# Patient Record
Sex: Male | Born: 2000 | Race: Black or African American | Hispanic: No | Marital: Single | Smoking: Former smoker
Health system: Southern US, Community
[De-identification: ages and names within clinical notes are randomized; demographics above are authoritative.]

---
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
## 2021-01-19 ENCOUNTER — Emergency Department (HOSPITAL_BASED_OUTPATIENT_CLINIC_OR_DEPARTMENT_OTHER)
Admission: EM | Admit: 2021-01-19 | Discharge: 2021-01-19 | Disposition: A | Discharge status: Home / Self Care | Payer: No Typology Code available for payment source | Attending: Emergency Medicine | Admitting: Emergency Medicine

## 2021-01-19 ENCOUNTER — Other Ambulatory Visit: Payer: Self-pay

## 2021-01-19 ENCOUNTER — Other Ambulatory Visit (HOSPITAL_BASED_OUTPATIENT_CLINIC_OR_DEPARTMENT_OTHER): Payer: Self-pay

## 2021-01-19 ENCOUNTER — Encounter (HOSPITAL_BASED_OUTPATIENT_CLINIC_OR_DEPARTMENT_OTHER): Payer: Self-pay

## 2021-01-19 DIAGNOSIS — B349 Viral infection, unspecified: Secondary | ICD-10-CM | POA: Insufficient documentation

## 2021-01-19 DIAGNOSIS — Z28310 Unvaccinated for covid-19: Secondary | ICD-10-CM | POA: Insufficient documentation

## 2021-01-19 DIAGNOSIS — Z20822 Contact with and (suspected) exposure to covid-19: Secondary | ICD-10-CM | POA: Insufficient documentation

## 2021-01-19 DIAGNOSIS — F17290 Nicotine dependence, other tobacco product, uncomplicated: Secondary | ICD-10-CM | POA: Insufficient documentation

## 2021-01-19 DIAGNOSIS — R0981 Nasal congestion: Secondary | ICD-10-CM | POA: Diagnosis present

## 2021-01-19 LAB — SARS CORONAVIRUS 2 (TAT 6-24 HRS): SARS Coronavirus 2: NEGATIVE

## 2021-01-19 MED ORDER — FLUTICASONE PROPIONATE 50 MCG/ACT NA SUSP
1.0000 | Freq: Every day | NASAL | 0 refills | Status: DC
  Filled 2021-01-19: qty 16, 60d supply, fill #0

## 2021-01-19 MED ORDER — BENZONATATE 100 MG PO CAPS: 100.0000 mg | ORAL_CAPSULE | Freq: Three times a day (TID) | ORAL | 0 refills | Status: AC

## 2021-01-19 NOTE — Discharge Instructions (Addendum)
You likely have a viral illness.  This should be treated symptomatically. Your COVID test is pending.  You may check the results on MyChart. Use Tylenol or ibuprofen as needed for fevers or body aches. Use Flonase daily for nasal congestion and cough. Use Tessalon for cough. Make sure you stay well-hydrated with water. Wash your hands frequently to prevent spread of infection. Follow-up with your primary care doctor in 1 week if your symptoms are not improving. Return to the emergency room if you develop chest pain, difficulty breathing, or any new or worsening symptoms.

## 2021-01-19 NOTE — ED Provider Notes (Signed)
[facility] EMERGENCY DEPARTMENT Provider Note   CSN: [id number] Arrival date & time: 01/19/21  1108     History Chief Complaint  Patient presents with  . Covid Exposure    Max French is a 20 y.o. male presenting for evaluation of nasal congestion, sore throat, cough.  Patient states his symptoms began 2 days ago.  His sore throat is improving, however he continues to have significant congestion and a productive cough.  He denies fevers, chest pain, shortness of breath, nausea, vomiting, Donnell pain.  He has no medical problems, takes no medications daily.  He is unvaccinated for COVID.  He was exposed to somebody about 10 days ago who tested positive for COVID.  He had similar symptoms a year ago when he tested positive for COVID.  HPI     History reviewed. No pertinent past medical history.  There are no problems to display for this patient.   History reviewed. No pertinent surgical history.     History reviewed. No pertinent family history.  Social History   Tobacco Use  . Smoking status: Every Day    Types: Cigars  . Smokeless tobacco: Never  Vaping Use  . Vaping Use: Never used  Substance Use Topics  . Alcohol use: Yes    Comment: occ  . Drug use: Yes    Types: Marijuana    Home Medications Prior to Admission medications   Medication Sig Start Date End Date Taking? Authorizing Provider  benzonatate (TESSALON) 100 MG capsule Take 1 capsule (100 mg total) by mouth every 8 (eight) hours. 01/19/21  Yes Shonte Soderlund, PA-C  fluticasone (FLONASE) 50 MCG/ACT nasal spray Place 1 spray into both nostrils daily. 01/19/21  Yes Miciah Covelli, PA-C    Allergies    Patient has no known allergies.  Review of Systems   Review of Systems  HENT:  Positive for congestion and sore throat (resolved).   Respiratory:  Positive for cough.   All other systems reviewed and are negative.  Physical Exam Updated Vital Signs BP 132/82 (BP Location: Left  Arm)   Pulse 86   Temp 98.5 F (36.9 C) (Oral)   Resp 16   Ht 5\' 8"  (1.727 m)   Wt 68 kg   SpO2 96%   BMI 22.81 kg/m   Physical Exam Vitals and nursing note reviewed.  Constitutional:      General: He is not in acute distress.    Appearance: Normal appearance.     Comments: Resting in the bed in NAD  HENT:     Head: Normocephalic and atraumatic.     Right Ear: Tympanic membrane, ear canal and external ear normal.     Left Ear: Tympanic membrane, ear canal and external ear normal.     Nose: Mucosal edema and congestion present.     Mouth/Throat:     Pharynx: Uvula midline. Posterior oropharyngeal erythema present.     Comments: OP mildly erythematous without tonsillar swelling or exudates.  Uvula midline. Eyes:     Extraocular Movements: Extraocular movements intact.     Conjunctiva/sclera: Conjunctivae normal.     Pupils: Pupils are equal, round, and reactive to light.  Cardiovascular:     Rate and Rhythm: Normal rate and regular rhythm.     Pulses: Normal pulses.  Pulmonary:     Effort: Pulmonary effort is normal.     Breath sounds: Normal breath sounds. No decreased breath sounds, wheezing, rhonchi or rales.     Comments: Speaking  in full sentences.  Clear lung sounds in all fields.  Sats stable on room air. Abdominal:     General: There is no distension.     Palpations: Abdomen is soft.     Tenderness: There is no abdominal tenderness.  Musculoskeletal:        General: Normal range of motion.     Cervical back: Normal range of motion.  Lymphadenopathy:     Cervical: No cervical adenopathy.  Skin:    General: Skin is warm.     Capillary Refill: Capillary refill takes less than 2 seconds.  Neurological:     Mental Status: He is alert and oriented to person, place, and time.    ED Results / Procedures / Treatments   Labs (all labs ordered are listed, but only abnormal results are displayed) Labs Reviewed  SARS CORONAVIRUS 2 (TAT 6-24 HRS)     EKG None  Radiology No results found.  Procedures Procedures   Medications Ordered in ED Medications - No data to display  ED Course  I have reviewed the triage vital signs and the nursing notes.  Pertinent labs & imaging results that were available during my care of the patient were reviewed by me and considered in my medical decision making (see chart for details).    MDM Rules/Calculators/A&P                           Patient presenting with 3 days h/o URI symptoms.  Physical exam reassuring, patient is afebrile and appears nontoxic.  Pulmonary exam reassuring.  Doubt pneumonia, strep, other bacterial infection, or peritonsillar abscess.  Likely viral URI.  Will treat symptomatically. Covid test pending.  Patient to follow-up with primary care as needed.  At this time, patient appears safe for discharge.  Return precautions given.  Patient states he understands and agrees to plan.  Max French was evaluated in Emergency Department on 01/19/2021 for the symptoms described in the history of present illness. He was evaluated in the context of the global COVID-19 pandemic, which necessitated consideration that the patient might be at risk for infection with the SARS-CoV-2 virus that causes COVID-19. Institutional protocols and algorithms that pertain to the evaluation of patients at risk for COVID-19 are in a state of rapid change based on information released by regulatory bodies including the CDC and federal and state organizations. These policies and algorithms were followed during the patient's care in the ED.   Final Clinical Impression(s) / ED Diagnoses Final diagnoses:  Viral illness  Suspected COVID-19 virus infection    Rx / DC Orders ED Discharge Orders          Ordered    fluticasone (FLONASE) 50 MCG/ACT nasal spray  Daily        01/19/21 1154    benzonatate (TESSALON) 100 MG capsule  Every 8 hours        01/19/21 1154             Hasina Kreager,  PA-C 01/19/21 1158    Derwood Kaplan, MD 01/20/21 0710

## 2021-01-19 NOTE — ED Triage Notes (Signed)
Pt c/o congestion, sore throat, 1 episode vomiting, decreased appetite, headache, bodyaches since Monday. Requesting covid test. States was exposed to someone with covid.

## 2021-08-23 ENCOUNTER — Other Ambulatory Visit: Payer: Self-pay

## 2021-08-23 ENCOUNTER — Emergency Department (HOSPITAL_BASED_OUTPATIENT_CLINIC_OR_DEPARTMENT_OTHER)
Admission: EM | Admit: 2021-08-23 | Discharge: 2021-08-23 | Disposition: A | Discharge status: Home / Self Care | Payer: No Typology Code available for payment source | Attending: Emergency Medicine | Admitting: Emergency Medicine

## 2021-08-23 ENCOUNTER — Encounter (HOSPITAL_BASED_OUTPATIENT_CLINIC_OR_DEPARTMENT_OTHER): Payer: Self-pay

## 2021-08-23 DIAGNOSIS — K529 Noninfective gastroenteritis and colitis, unspecified: Secondary | ICD-10-CM | POA: Diagnosis not present

## 2021-08-23 DIAGNOSIS — D72829 Elevated white blood cell count, unspecified: Secondary | ICD-10-CM | POA: Diagnosis not present

## 2021-08-23 DIAGNOSIS — R739 Hyperglycemia, unspecified: Secondary | ICD-10-CM | POA: Diagnosis not present

## 2021-08-23 DIAGNOSIS — R112 Nausea with vomiting, unspecified: Secondary | ICD-10-CM | POA: Diagnosis present

## 2021-08-23 LAB — COMPREHENSIVE METABOLIC PANEL
ALT: 13 U/L (ref 0–44)
AST: 15 U/L (ref 15–41)
Albumin: 5.2 g/dL — ABNORMAL HIGH (ref 3.5–5.0)
Alkaline Phosphatase: 60 U/L (ref 38–126)
Anion gap: 17 — ABNORMAL HIGH (ref 5–15)
BUN: 15 mg/dL (ref 6–20)
CO2: 22 mmol/L (ref 22–32)
Calcium: 10.4 mg/dL — ABNORMAL HIGH (ref 8.9–10.3)
Chloride: 100 mmol/L (ref 98–111)
Creatinine, Ser: 1.13 mg/dL (ref 0.61–1.24)
GFR, Estimated: >60 mL/min (ref >60)
Glucose, Bld: 149 mg/dL — ABNORMAL HIGH (ref 70–99)
Potassium: 4.5 mmol/L (ref 3.5–5.1)
Sodium: 139 mmol/L (ref 135–145)
Total Bilirubin: 2.4 mg/dL — ABNORMAL HIGH (ref 0.3–1.2)
Total Protein: 7.7 g/dL (ref 6.5–8.1)

## 2021-08-23 LAB — CBC
HCT: 45.4 % (ref 39.0–52.0)
Hemoglobin: 16.1 g/dL (ref 13.0–17.0)
MCH: 29.1 pg (ref 26.0–34.0)
MCHC: 35.5 g/dL (ref 30.0–36.0)
MCV: 82.1 fL (ref 80.0–100.0)
Platelets: 180 10*3/uL (ref 150–400)
RBC: 5.53 MIL/uL (ref 4.22–5.81)
RDW: 11.9 % (ref 11.5–15.5)
WBC: 10.8 10*3/uL — ABNORMAL HIGH (ref 4.0–10.5)
nRBC: 0 % (ref 0.0–0.2)

## 2021-08-23 LAB — LIPASE, BLOOD: Lipase: <10 U/L — ABNORMAL LOW (ref 11–51)

## 2021-08-23 MED ORDER — ACETAMINOPHEN 500 MG PO TABS
1000.0000 mg | ORAL_TABLET | Freq: Once | ORAL | Status: AC
  Administered 2021-08-23: 1000 mg via ORAL
  Filled 2021-08-23: qty 2

## 2021-08-23 MED ORDER — SODIUM CHLORIDE 0.9 % IV BOLUS
1000.0000 mL | Freq: Once | INTRAVENOUS | Status: AC
  Administered 2021-08-23: 1000 mL via INTRAVENOUS

## 2021-08-23 MED ORDER — ONDANSETRON 8 MG PO TBDP
8.0000 mg | ORAL_TABLET | Freq: Three times a day (TID) | ORAL | 0 refills | Status: AC | PRN
Start: 2021-08-23 — End: ?

## 2021-08-23 MED ORDER — ONDANSETRON HCL 4 MG/2ML IJ SOLN
4.0000 mg | Freq: Once | INTRAMUSCULAR | Status: AC | PRN
  Administered 2021-08-23: 4 mg via INTRAVENOUS
  Filled 2021-08-23: qty 2

## 2021-08-23 NOTE — ED Provider Notes (Signed)
?[facility] EMERGENCY DEPT ?Provider Note ? ? ?CSN: [id number] ?Arrival date & time: 08/23/21  2211 ? ?  ? ?History ? ?Chief Complaint  ?Patient presents with  ? Emesis  ? ? ?Max French is a 21 y.o. male. ? ? ?Emesis ? ?Patient is a 21 year old male presenting today due to emesis, diarrhea and nausea.  Started acutely this morning when he ate expired hotdog.  He has tried Zofran and Pepto without any alleviation of his symptoms.  Unable to quantify how many times he has vomited or had diarrhea today.  No dysuria, hematuria, focal abdominal pain, recent travel, recent antibiotic use. ? ?Home Medications ?Prior to Admission medications   ?Medication Sig Start Date End Date Taking? Authorizing Provider  ?ondansetron (ZOFRAN-ODT) 8 MG disintegrating tablet Take 1 tablet (8 mg total) by mouth every 8 (eight) hours as needed for nausea or vomiting. 08/23/21  Yes Sherrill Raring, PA-C  ?benzonatate (TESSALON) 100 MG capsule Take 1 capsule (100 mg total) by mouth every 8 (eight) hours. 01/19/21   Caccavale, Sophia, PA-C  ?fluticasone (FLONASE) 50 MCG/ACT nasal spray Place 1 spray into both nostrils daily. 01/19/21 01/19/21  Caccavale, Sophia, PA-C  ?   ? ?Allergies    ?Patient has no known allergies.   ? ?Review of Systems   ?Review of Systems  ?Gastrointestinal:  Positive for vomiting.  ? ?Physical Exam ?Updated Vital Signs ?BP 128/78 (BP Location: Right Arm)   Pulse 81   Temp 99.4 ?F (37.4 ?C) (Oral)   Resp (!) 22   Ht 5\' 8"  (1.727 m)   Wt 68 kg   SpO2 100%   BMI 22.79 kg/m?  ?Physical Exam ?Vitals and nursing note reviewed. Exam conducted with a chaperone present.  ?Constitutional:   ?   Appearance: Normal appearance.  ?HENT:  ?   Head: Normocephalic and atraumatic.  ?Eyes:  ?   General: No scleral icterus.    ?   Right eye: No discharge.     ?   Left eye: No discharge.  ?   Extraocular Movements: Extraocular movements intact.  ?   Pupils: Pupils are equal, round, and reactive to light.  ?Cardiovascular:  ?    Rate and Rhythm: Normal rate and regular rhythm.  ?   Pulses: Normal pulses.  ?   Heart sounds: Normal heart sounds. No murmur heard. ?  No friction rub. No gallop.  ?Pulmonary:  ?   Effort: Pulmonary effort is normal. No respiratory distress.  ?   Breath sounds: Normal breath sounds.  ?Abdominal:  ?   General: Abdomen is flat. Bowel sounds are normal. There is no distension.  ?   Palpations: Abdomen is soft.  ?   Tenderness: There is abdominal tenderness.  ?   Comments: Diffuse abdominal tenderness with voluntary guarding  ?Skin: ?   General: Skin is warm and dry.  ?   Coloration: Skin is not jaundiced.  ?Neurological:  ?   Mental Status: He is alert. Mental status is at baseline.  ?   Coordination: Coordination normal.  ? ? ?ED Results / Procedures / Treatments   ?Labs ?(all labs ordered are listed, but only abnormal results are displayed) ?Labs Reviewed  ?LIPASE, BLOOD - Abnormal; Notable for the following components:  ?    Result Value  ? Lipase <10 (*)   ? All other components within normal limits  ?COMPREHENSIVE METABOLIC PANEL - Abnormal; Notable for the following components:  ? Glucose, Bld 149 (*)   ?  Calcium 10.4 (*)   ? Albumin 5.2 (*)   ? Total Bilirubin 2.4 (*)   ? Anion gap 17 (*)   ? All other components within normal limits  ?CBC - Abnormal; Notable for the following components:  ? WBC 10.8 (*)   ? All other components within normal limits  ? ? ?EKG ?None ? ?Radiology ?No results found. ? ?Procedures ?Procedures  ? ? ?Medications Ordered in ED ?Medications  ?ondansetron (ZOFRAN) injection 4 mg (4 mg Intravenous Given 08/23/21 2247)  ?acetaminophen (TYLENOL) tablet 1,000 mg (1,000 mg Oral Given 08/23/21 2310)  ?sodium chloride 0.9 % bolus 1,000 mL (1,000 mLs Intravenous New Bag/Given 08/23/21 2247)  ? ? ?ED Course/ Medical Decision Making/ A&P ?  ?                        ?Medical Decision Making ?Amount and/or Complexity of Data Reviewed ?Labs: ordered. ? ?Risk ?OTC drugs. ?Prescription drug  management. ? ? ?This patient presents to the ED for concern of V/D/N, this involves an extensive number of treatment options, and is a complaint that carries with it a high risk of complications and morbidity.  The differential diagnosis includes but not limited to: gastroenteritis, dehydration, electrolyte derragenemtn ? ? ?Additional history obtained:  ? ?Independent historian: girlfriend ? ?  ?Lab Tests: ? ?I ordered, viewed, and personally interpreted labs.  The pertinent results include:  ?CBC with mild leukocytosis at 10.8.  ?CMP with hyperglycemia at 149, no AKI. Slightly hypercalcemic 10.4, elevated bilirubin at 2.4. No prior labs to compare to. No gross electrolyte derangement.  ?Lipase: unremarkable  ?  ?ECG/Cardiac monitoring:  ? ? ?The patient was maintained on a cardiac monitor.  Visualized monitor strip which showed NSR, HR 81 per my interpretation.  ? ? ?Medicines ordered and prescription drug management: ? ?I ordered medication including: fluids, tylenol, zofran   ? ?I have reviewed the patients home medicines and have made adjustments as needed ? ?Reevaluation: ? ?After the interventions noted above, I reevaluated the patient and found patient feels improved. Tolerating PO, symptoms resolved.  ? ? ?Problems addressed / ED Course: ?N/V/D - suspect gastroenteritis. Improved with zofran, tolerating PO. No electrolyte derangement or AKI. D/C with zofran, PCP referral provided.  ? ?  ?Social Determinants of Health: ?Young, does not see PCP regularly  ?  ? ?Disposition: ? ?After consideration of the diagnostic results and the patients response to treatment, I feel that the patent would benefit from outpatient F/U. ? ?  ? ? ? ? ? ? ? ? ?Final Clinical Impression(s) / ED Diagnoses ?Final diagnoses:  ?Gastroenteritis  ? ? ?Rx / DC Orders ?ED Discharge Orders   ? ?      Ordered  ?  ondansetron (ZOFRAN-ODT) 8 MG disintegrating tablet  Every 8 hours PRN       ? 08/23/21 2255  ? ?  ?  ? ?  ? ? ?  ?Sherrill Raring,  PA-C ?08/23/21 2315 ? ?  ?Dorie Rank, MD ?08/26/21 [phone] ? ?

## 2021-08-23 NOTE — ED Triage Notes (Signed)
Patient here POV from Home with Emesis. ? ?Patient has been having N/V/D since this AM. Patient endorses eating Old Hot Dogs last PM. ? ?Patient took Zofran and Pepto Bismol today with No Relief. 100.0 Temp at Home. ? ?NAD Noted during Triage. A&Ox4. GCS 15. Ambulatory. ? ? ?

## 2021-08-23 NOTE — Discharge Instructions (Addendum)
Take zofran as needed every 8 hours, put under your tongue. ?Drink fluids, return if worse.  ?
